# Patient Record
Sex: Female | Born: 1965 | Race: White | Hispanic: No | Marital: Married | Smoking: Current every day smoker
Health system: Southern US, Community
[De-identification: ages and names within clinical notes are randomized; demographics above are authoritative.]

## PROBLEM LIST (undated history)

## (undated) HISTORY — PX: CARPAL TUNNEL RELEASE: SHX101

---

## 1997-07-09 ENCOUNTER — Other Ambulatory Visit: Admission: RE | Admit: 1997-07-09 | Discharge: 1997-07-09 | Payer: Self-pay | Admitting: *Deleted

## 1998-02-01 ENCOUNTER — Inpatient Hospital Stay (HOSPITAL_COMMUNITY): Admission: AD | Admit: 1998-02-01 | Discharge: 1998-02-04 | Payer: Self-pay | Admitting: *Deleted

## 1998-03-06 ENCOUNTER — Other Ambulatory Visit: Admission: RE | Admit: 1998-03-06 | Discharge: 1998-03-06 | Payer: Self-pay | Admitting: *Deleted

## 1999-03-24 ENCOUNTER — Other Ambulatory Visit: Admission: RE | Admit: 1999-03-24 | Discharge: 1999-03-24 | Payer: Self-pay | Admitting: *Deleted

## 2000-05-02 ENCOUNTER — Other Ambulatory Visit: Admission: RE | Admit: 2000-05-02 | Discharge: 2000-05-02 | Payer: Self-pay | Admitting: *Deleted

## 2001-05-28 ENCOUNTER — Other Ambulatory Visit: Admission: RE | Admit: 2001-05-28 | Discharge: 2001-05-28 | Payer: Self-pay | Admitting: *Deleted

## 2001-07-24 ENCOUNTER — Ambulatory Visit (HOSPITAL_COMMUNITY): Admission: RE | Admit: 2001-07-24 | Discharge: 2001-07-24 | Payer: Self-pay | Admitting: *Deleted

## 2003-07-01 ENCOUNTER — Other Ambulatory Visit: Admission: RE | Admit: 2003-07-01 | Discharge: 2003-07-01 | Payer: Self-pay | Admitting: *Deleted

## 2010-07-16 ENCOUNTER — Emergency Department: Payer: Self-pay | Admitting: Emergency Medicine

## 2010-10-03 ENCOUNTER — Other Ambulatory Visit: Payer: Self-pay | Admitting: Obstetrics and Gynecology

## 2010-10-03 DIAGNOSIS — N6320 Unspecified lump in the left breast, unspecified quadrant: Secondary | ICD-10-CM

## 2010-10-06 ENCOUNTER — Ambulatory Visit
Admission: RE | Admit: 2010-10-06 | Discharge: 2010-10-06 | Disposition: A | Discharge status: Home / Self Care | Payer: 59 | Source: Ambulatory Visit | Attending: Obstetrics and Gynecology | Admitting: Obstetrics and Gynecology

## 2010-10-06 DIAGNOSIS — N6320 Unspecified lump in the left breast, unspecified quadrant: Secondary | ICD-10-CM

## 2010-12-08 ENCOUNTER — Emergency Department: Payer: Self-pay | Admitting: Emergency Medicine

## 2011-04-06 ENCOUNTER — Encounter: Payer: Self-pay | Admitting: Gastroenterology

## 2011-04-13 ENCOUNTER — Other Ambulatory Visit: Payer: Self-pay | Admitting: Obstetrics and Gynecology

## 2011-04-13 DIAGNOSIS — R928 Other abnormal and inconclusive findings on diagnostic imaging of breast: Secondary | ICD-10-CM

## 2011-04-19 ENCOUNTER — Ambulatory Visit: Payer: 59 | Admitting: Gastroenterology

## 2011-04-20 ENCOUNTER — Ambulatory Visit (INDEPENDENT_AMBULATORY_CARE_PROVIDER_SITE_OTHER): Payer: Self-pay | Admitting: General Surgery

## 2011-04-24 ENCOUNTER — Other Ambulatory Visit: Payer: 59

## 2011-04-25 ENCOUNTER — Other Ambulatory Visit: Payer: 59

## 2011-04-26 ENCOUNTER — Ambulatory Visit
Admission: RE | Admit: 2011-04-26 | Discharge: 2011-04-26 | Disposition: A | Discharge status: Home / Self Care | Payer: 59 | Source: Ambulatory Visit | Attending: Obstetrics and Gynecology | Admitting: Obstetrics and Gynecology

## 2011-04-26 DIAGNOSIS — R928 Other abnormal and inconclusive findings on diagnostic imaging of breast: Secondary | ICD-10-CM

## 2014-07-08 ENCOUNTER — Emergency Department (HOSPITAL_COMMUNITY): Payer: 59

## 2014-07-08 ENCOUNTER — Emergency Department (HOSPITAL_COMMUNITY)
Admission: EM | Admit: 2014-07-08 | Discharge: 2014-07-08 | Disposition: A | Discharge status: Home / Self Care | Payer: 59 | Attending: Emergency Medicine | Admitting: Emergency Medicine

## 2014-07-08 ENCOUNTER — Encounter (HOSPITAL_COMMUNITY): Payer: Self-pay | Admitting: *Deleted

## 2014-07-08 DIAGNOSIS — Y9389 Activity, other specified: Secondary | ICD-10-CM | POA: Diagnosis not present

## 2014-07-08 DIAGNOSIS — S3992XA Unspecified injury of lower back, initial encounter: Secondary | ICD-10-CM | POA: Diagnosis not present

## 2014-07-08 DIAGNOSIS — Y92410 Unspecified street and highway as the place of occurrence of the external cause: Secondary | ICD-10-CM | POA: Insufficient documentation

## 2014-07-08 DIAGNOSIS — Z720 Tobacco use: Secondary | ICD-10-CM | POA: Diagnosis not present

## 2014-07-08 DIAGNOSIS — S161XXA Strain of muscle, fascia and tendon at neck level, initial encounter: Secondary | ICD-10-CM | POA: Diagnosis not present

## 2014-07-08 DIAGNOSIS — Y998 Other external cause status: Secondary | ICD-10-CM | POA: Diagnosis not present

## 2014-07-08 DIAGNOSIS — S199XXA Unspecified injury of neck, initial encounter: Secondary | ICD-10-CM | POA: Diagnosis present

## 2014-07-08 MED ORDER — HYDROCODONE-ACETAMINOPHEN 5-325 MG PO TABS
1.0000 | ORAL_TABLET | ORAL | Status: AC
  Administered 2014-07-08: 1 via ORAL
  Filled 2014-07-08: qty 1

## 2014-07-08 MED ORDER — NAPROXEN 500 MG PO TABS: 500.0000 mg | ORAL_TABLET | Freq: Two times a day (BID) | ORAL | Status: AC

## 2014-07-08 MED ORDER — CYCLOBENZAPRINE HCL 5 MG PO TABS: 5.0000 mg | ORAL_TABLET | Freq: Three times a day (TID) | ORAL | Status: DC | PRN
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
## 2014-07-08 NOTE — Discharge Instructions (Signed)
Cervical Sprain °A cervical sprain is an injury in the neck in which the strong, fibrous tissues (ligaments) that connect your neck bones stretch or tear. Cervical sprains can range from mild to severe. Severe cervical sprains can cause the neck vertebrae to be unstable. This can lead to damage of the spinal cord and can result in serious nervous system problems. The amount of time it takes for a cervical sprain to get better depends on the cause and extent of the injury. Most cervical sprains heal in 1 to 3 weeks. °CAUSES  °Severe cervical sprains may be caused by:  °· Contact sport injuries (such as from football, rugby, wrestling, hockey, auto racing, gymnastics, diving, martial arts, or boxing).   °· Motor vehicle collisions.   °· Whiplash injuries. This is an injury from a sudden forward and backward whipping movement of the head and neck.  °· Falls.   °Mild cervical sprains may be caused by:  °· Being in an awkward position, such as while cradling a telephone between your ear and shoulder.   °· Sitting in a chair that does not offer proper support.   °· Working at a poorly designed computer station.   °· Looking up or down for long periods of time.   °SYMPTOMS  °· Pain, soreness, stiffness, or a burning sensation in the front, back, or sides of the neck. This discomfort may develop immediately after the injury or slowly, 24 hours or more after the injury.   °· Pain or tenderness directly in the middle of the back of the neck.   °· Shoulder or upper back pain.   °· Limited ability to move the neck.   °· Headache.   °· Dizziness.   °· Weakness, numbness, or tingling in the hands or arms.   °· Muscle spasms.   °· Difficulty swallowing or chewing.   °· Tenderness and swelling of the neck.   °DIAGNOSIS  °Most of the time your health care provider can diagnose a cervical sprain by taking your history and doing a physical exam. Your health care provider will ask about previous neck injuries and any known neck  problems, such as arthritis in the neck. X-rays may be taken to find out if there are any other problems, such as with the bones of the neck. Other tests, such as a CT scan or MRI, may also be needed.  °TREATMENT  °Treatment depends on the severity of the cervical sprain. Mild sprains can be treated with rest, keeping the neck in place (immobilization), and pain medicines. Severe cervical sprains are immediately immobilized. Further treatment is done to help with pain, muscle spasms, and other symptoms and may include: °· Medicines, such as pain relievers, numbing medicines, or muscle relaxants.   °· Physical therapy. This may involve stretching exercises, strengthening exercises, and posture training. Exercises and improved posture can help stabilize the neck, strengthen muscles, and help stop symptoms from returning.   °HOME CARE INSTRUCTIONS  °· Put ice on the injured area.   °¨ Put ice in a plastic bag.   °¨ Place a towel between your skin and the bag.   °¨ Leave the ice on for 15-20 minutes, 3-4 times a day.   °· If your injury was severe, you may have been given a cervical collar to wear. A cervical collar is a two-piece collar designed to keep your neck from moving while it heals. °¨ Do not remove the collar unless instructed by your health care provider. °¨ If you have long hair, keep it outside of the collar. °¨ Ask your health care provider before making any adjustments to your collar. Minor   adjustments may be required over time to improve comfort and reduce pressure on your chin or on the back of your head. °¨ If you are allowed to remove the collar for cleaning or bathing, follow your health care provider's instructions on how to do so safely. °¨ Keep your collar clean by wiping it with mild soap and water and drying it completely. If the collar you have been given includes removable pads, remove them every 1-2 days and hand wash them with soap and water. Allow them to air dry. They should be completely  dry before you wear them in the collar. °¨ If you are allowed to remove the collar for cleaning and bathing, wash and dry the skin of your neck. Check your skin for irritation or sores. If you see any, tell your health care provider. °¨ Do not drive while wearing the collar.   °· Only take over-the-counter or prescription medicines for pain, discomfort, or fever as directed by your health care provider.   °· Keep all follow-up appointments as directed by your health care provider.   °· Keep all physical therapy appointments as directed by your health care provider.   °· Make any needed adjustments to your workstation to promote good posture.   °· Avoid positions and activities that make your symptoms worse.   °· Warm up and stretch before being active to help prevent problems.   °SEEK MEDICAL CARE IF:  °· Your pain is not controlled with medicine.   °· You are unable to decrease your pain medicine over time as planned.   °· Your activity level is not improving as expected.   °SEEK IMMEDIATE MEDICAL CARE IF:  °· You develop any bleeding. °· You develop stomach upset. °· You have signs of an allergic reaction to your medicine.   °· Your symptoms get worse.   °· You develop new, unexplained symptoms.   °· You have numbness, tingling, weakness, or paralysis in any part of your body.   °MAKE SURE YOU:  °· Understand these instructions. °· Will watch your condition. °· Will get help right away if you are not doing well or get worse. °Document Released: 12/18/2006 Document Revised: 02/25/2013 Document Reviewed: 08/28/2012 °[employer]® Patient Information ©2015 [employer]. This information is not intended to replace advice given to you by your health care provider. Make sure you discuss any questions you have with your health care provider. ° °Motor Vehicle Collision °It is common to have multiple bruises and sore muscles after a motor vehicle collision (MVC). These tend to feel worse for the first 24 hours. You may have  the most stiffness and soreness over the first several hours. You may also feel worse when you wake up the first morning after your collision. After this point, you will usually begin to improve with each day. The speed of improvement often depends on the severity of the collision, the number of injuries, and the location and nature of these injuries. °HOME CARE INSTRUCTIONS °· Put ice on the injured area. °¨ Put ice in a plastic bag. °¨ Place a towel between your skin and the bag. °¨ Leave the ice on for 15-20 minutes, 3-4 times a day, or as directed by your health care provider. °· Drink enough fluids to keep your urine clear or pale yellow. Do not drink alcohol. °· Take a warm shower or bath once or twice a day. This will increase blood flow to sore muscles. °· You may return to activities as directed by your caregiver. Be careful when lifting, as this may aggravate neck or back   pain. °· Only take over-the-counter or prescription medicines for pain, discomfort, or fever as directed by your caregiver. Do not use aspirin. This may increase bruising and bleeding. °SEEK IMMEDIATE MEDICAL CARE IF: °· You have numbness, tingling, or weakness in the arms or legs. °· You develop severe headaches not relieved with medicine. °· You have severe neck pain, especially tenderness in the middle of the back of your neck. °· You have changes in bowel or bladder control. °· There is increasing pain in any area of the body. °· You have shortness of breath, light-headedness, dizziness, or fainting. °· You have chest pain. °· You feel sick to your stomach (nauseous), throw up (vomit), or sweat. °· You have increasing abdominal discomfort. °· There is blood in your urine, stool, or vomit. °· You have pain in your shoulder (shoulder strap areas). °· You feel your symptoms are getting worse. °MAKE SURE YOU: °· Understand these instructions. °· Will watch your condition. °· Will get help right away if you are not doing well or get  worse. °Document Released: 02/20/2005 Document Revised: 07/07/2013 Document Reviewed: 07/20/2010 °Exit[employer]® Patient Information ©2015 [employer]. This information is not intended to replace advice given to you by your health care provider. Make sure you discuss any questions you have with your health care provider. ° °
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
## 2021-09-10 ENCOUNTER — Emergency Department
Admission: EM | Admit: 2021-09-10 | Discharge: 2021-09-10 | Disposition: A | Discharge status: Home / Self Care | Payer: 59 | Attending: Student in an Organized Health Care Education/Training Program | Admitting: Student in an Organized Health Care Education/Training Program

## 2021-09-10 ENCOUNTER — Encounter: Payer: Self-pay | Admitting: Emergency Medicine

## 2021-09-10 ENCOUNTER — Other Ambulatory Visit: Payer: Self-pay

## 2021-09-10 DIAGNOSIS — W25XXXA Contact with sharp glass, initial encounter: Secondary | ICD-10-CM | POA: Insufficient documentation

## 2021-09-10 DIAGNOSIS — S51812A Laceration without foreign body of left forearm, initial encounter: Secondary | ICD-10-CM | POA: Insufficient documentation

## 2021-09-10 DIAGNOSIS — Z23 Encounter for immunization: Secondary | ICD-10-CM | POA: Diagnosis not present

## 2021-09-10 DIAGNOSIS — Y9389 Activity, other specified: Secondary | ICD-10-CM | POA: Diagnosis not present

## 2021-09-10 DIAGNOSIS — W19XXXA Unspecified fall, initial encounter: Secondary | ICD-10-CM

## 2021-09-10 MED ORDER — CYCLOBENZAPRINE HCL 10 MG PO TABS
5.0000 mg | ORAL_TABLET | Freq: Once | ORAL | Status: AC
  Administered 2021-09-10: 5 mg via ORAL
  Filled 2021-09-10: qty 1

## 2021-09-10 MED ORDER — CYCLOBENZAPRINE HCL 5 MG PO TABS: 5.0000 mg | ORAL_TABLET | Freq: Three times a day (TID) | ORAL | 0 refills | Status: AC | PRN

## 2021-09-10 MED ORDER — LIDOCAINE HCL (PF) 1 % IJ SOLN
10.0000 mL | Freq: Once | INTRAMUSCULAR | Status: AC
  Administered 2021-09-10: 10 mL
  Filled 2021-09-10: qty 10

## 2021-09-10 MED ORDER — TETANUS-DIPHTH-ACELL PERTUSSIS 5-2.5-18.5 LF-MCG/0.5 IM SUSY
0.5000 mL | PREFILLED_SYRINGE | Freq: Once | INTRAMUSCULAR | Status: AC
  Administered 2021-09-10: 0.5 mL via INTRAMUSCULAR
  Filled 2021-09-10: qty 0.5

## 2021-09-10 MED ORDER — LIDOCAINE HCL (PF) 1 % IJ SOLN
5.0000 mL | Freq: Once | INTRAMUSCULAR | Status: DC
  Filled 2021-09-10: qty 5
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
## 2021-09-10 NOTE — ED Notes (Signed)
Dc ppw provided to patient. followup and RX information reviewed. Pt provides verbal consent for dc at this time and declines vs at time of dc. Pt ambulatory to lobby alert and oriented. 

## 2021-09-10 NOTE — Discharge Instructions (Addendum)
Keep the wound clean, dry, and covered.  See your primary provider or local urgent care for suture removal in 7 to 10 days.  You did have injury to the muscle at the wrist, but this should heal without difficulty.  You may follow-up with Ortho hand, for any ongoing concern for wrist pain or weakness.
